# Patient Record
Sex: Female | Born: 1955 | Race: Black or African American | Hispanic: No | Marital: Single | Smoking: Former smoker
Health system: Southern US, Community
[De-identification: ages and names within clinical notes are randomized; demographics above are authoritative.]

## PROBLEM LIST (undated history)

## (undated) DIAGNOSIS — J449 Chronic obstructive pulmonary disease, unspecified: Secondary | ICD-10-CM

## (undated) DIAGNOSIS — F329 Major depressive disorder, single episode, unspecified: Secondary | ICD-10-CM

## (undated) DIAGNOSIS — R51 Headache: Secondary | ICD-10-CM

## (undated) DIAGNOSIS — F32A Depression, unspecified: Secondary | ICD-10-CM

## (undated) DIAGNOSIS — J189 Pneumonia, unspecified organism: Secondary | ICD-10-CM

## (undated) HISTORY — PX: ABDOMINAL HYSTERECTOMY: SHX81

---

## 1998-04-30 ENCOUNTER — Emergency Department (HOSPITAL_COMMUNITY): Admission: EM | Admit: 1998-04-30 | Discharge: 1998-04-30 | Payer: Self-pay | Admitting: Emergency Medicine

## 1998-05-03 ENCOUNTER — Ambulatory Visit (HOSPITAL_COMMUNITY): Admission: RE | Admit: 1998-05-03 | Discharge: 1998-05-03 | Payer: Self-pay

## 1998-05-03 ENCOUNTER — Encounter: Payer: Self-pay | Admitting: *Deleted

## 1998-05-27 ENCOUNTER — Ambulatory Visit (HOSPITAL_COMMUNITY): Admission: RE | Admit: 1998-05-27 | Discharge: 1998-05-27 | Payer: Self-pay | Admitting: *Deleted

## 1998-08-01 ENCOUNTER — Encounter: Payer: Self-pay | Admitting: Emergency Medicine

## 1998-08-01 ENCOUNTER — Emergency Department (HOSPITAL_COMMUNITY): Admission: EM | Admit: 1998-08-01 | Discharge: 1998-08-01 | Payer: Self-pay | Admitting: Emergency Medicine

## 1998-09-20 ENCOUNTER — Emergency Department (HOSPITAL_COMMUNITY): Admission: EM | Admit: 1998-09-20 | Discharge: 1998-09-20 | Payer: Self-pay | Admitting: Emergency Medicine

## 1998-09-21 ENCOUNTER — Encounter: Payer: Self-pay | Admitting: Emergency Medicine

## 1999-04-11 ENCOUNTER — Emergency Department (HOSPITAL_COMMUNITY): Admission: EM | Admit: 1999-04-11 | Discharge: 1999-04-11 | Payer: Self-pay | Admitting: Emergency Medicine

## 1999-06-23 ENCOUNTER — Emergency Department (HOSPITAL_COMMUNITY): Admission: EM | Admit: 1999-06-23 | Discharge: 1999-06-23 | Payer: Self-pay | Admitting: Emergency Medicine

## 1999-06-23 ENCOUNTER — Encounter: Payer: Self-pay | Admitting: Emergency Medicine

## 1999-08-21 ENCOUNTER — Emergency Department (HOSPITAL_COMMUNITY): Admission: EM | Admit: 1999-08-21 | Discharge: 1999-08-21 | Payer: Self-pay | Admitting: Internal Medicine

## 1999-08-21 ENCOUNTER — Encounter: Payer: Self-pay | Admitting: Emergency Medicine

## 1999-08-27 ENCOUNTER — Other Ambulatory Visit: Admission: RE | Admit: 1999-08-27 | Discharge: 1999-08-27 | Payer: Self-pay | Admitting: Obstetrics and Gynecology

## 1999-08-30 ENCOUNTER — Encounter: Payer: Self-pay | Admitting: Obstetrics and Gynecology

## 1999-08-30 ENCOUNTER — Ambulatory Visit (HOSPITAL_COMMUNITY): Admission: RE | Admit: 1999-08-30 | Discharge: 1999-08-30 | Payer: Self-pay | Admitting: Obstetrics and Gynecology

## 1999-10-08 ENCOUNTER — Emergency Department (HOSPITAL_COMMUNITY): Admission: EM | Admit: 1999-10-08 | Discharge: 1999-10-08 | Payer: Self-pay | Admitting: *Deleted

## 2000-12-28 ENCOUNTER — Emergency Department (HOSPITAL_COMMUNITY): Admission: EM | Admit: 2000-12-28 | Discharge: 2000-12-28 | Payer: Self-pay | Admitting: Emergency Medicine

## 2002-11-21 ENCOUNTER — Emergency Department (HOSPITAL_COMMUNITY): Admission: EM | Admit: 2002-11-21 | Discharge: 2002-11-21 | Payer: Self-pay | Admitting: Emergency Medicine

## 2003-05-30 ENCOUNTER — Emergency Department (HOSPITAL_COMMUNITY): Admission: EM | Admit: 2003-05-30 | Discharge: 2003-05-30 | Payer: Self-pay | Admitting: Emergency Medicine

## 2003-06-03 ENCOUNTER — Emergency Department (HOSPITAL_COMMUNITY): Admission: EM | Admit: 2003-06-03 | Discharge: 2003-06-03 | Payer: Self-pay | Admitting: Emergency Medicine

## 2003-07-13 ENCOUNTER — Other Ambulatory Visit: Admission: RE | Admit: 2003-07-13 | Discharge: 2003-07-13 | Payer: Self-pay | Admitting: Cardiology

## 2003-07-13 ENCOUNTER — Other Ambulatory Visit: Admission: RE | Admit: 2003-07-13 | Discharge: 2003-07-13 | Payer: Self-pay | Admitting: Nephrology

## 2004-05-28 ENCOUNTER — Emergency Department (HOSPITAL_COMMUNITY): Admission: EM | Admit: 2004-05-28 | Discharge: 2004-05-28 | Payer: Self-pay | Admitting: Emergency Medicine

## 2004-12-03 ENCOUNTER — Ambulatory Visit: Payer: Self-pay | Admitting: Internal Medicine

## 2004-12-18 ENCOUNTER — Ambulatory Visit: Payer: Self-pay | Admitting: Internal Medicine

## 2004-12-18 ENCOUNTER — Ambulatory Visit: Payer: Self-pay | Admitting: *Deleted

## 2004-12-18 DIAGNOSIS — A599 Trichomoniasis, unspecified: Secondary | ICD-10-CM

## 2005-01-23 ENCOUNTER — Ambulatory Visit: Payer: Self-pay | Admitting: Internal Medicine

## 2005-01-30 ENCOUNTER — Ambulatory Visit (HOSPITAL_COMMUNITY): Admission: RE | Admit: 2005-01-30 | Discharge: 2005-01-30 | Payer: Self-pay | Admitting: Family Medicine

## 2005-02-24 ENCOUNTER — Encounter: Admission: RE | Admit: 2005-02-24 | Discharge: 2005-02-24 | Payer: Self-pay | Admitting: Family Medicine

## 2005-04-03 ENCOUNTER — Ambulatory Visit (HOSPITAL_COMMUNITY): Admission: RE | Admit: 2005-04-03 | Discharge: 2005-04-03 | Payer: Self-pay | Admitting: Gastroenterology

## 2005-04-03 ENCOUNTER — Encounter (INDEPENDENT_AMBULATORY_CARE_PROVIDER_SITE_OTHER): Payer: Self-pay | Admitting: *Deleted

## 2005-04-03 DIAGNOSIS — D126 Benign neoplasm of colon, unspecified: Secondary | ICD-10-CM

## 2005-08-06 ENCOUNTER — Emergency Department (HOSPITAL_COMMUNITY): Admission: EM | Admit: 2005-08-06 | Discharge: 2005-08-06 | Payer: Self-pay | Admitting: Emergency Medicine

## 2005-12-10 ENCOUNTER — Encounter: Payer: Self-pay | Admitting: *Deleted

## 2006-01-02 ENCOUNTER — Ambulatory Visit: Payer: Self-pay | Admitting: Internal Medicine

## 2006-01-02 DIAGNOSIS — F329 Major depressive disorder, single episode, unspecified: Secondary | ICD-10-CM

## 2006-07-16 IMAGING — CR DG SHOULDER 2+V*R*
3 series · 3 of 3 positions shown · non-contrast
Comparison: None.

CLINICAL DATA: Shoulder pain. 
 RIGHT SHOULDER - 3 VIEW:

[t shoulder ap internal righ]
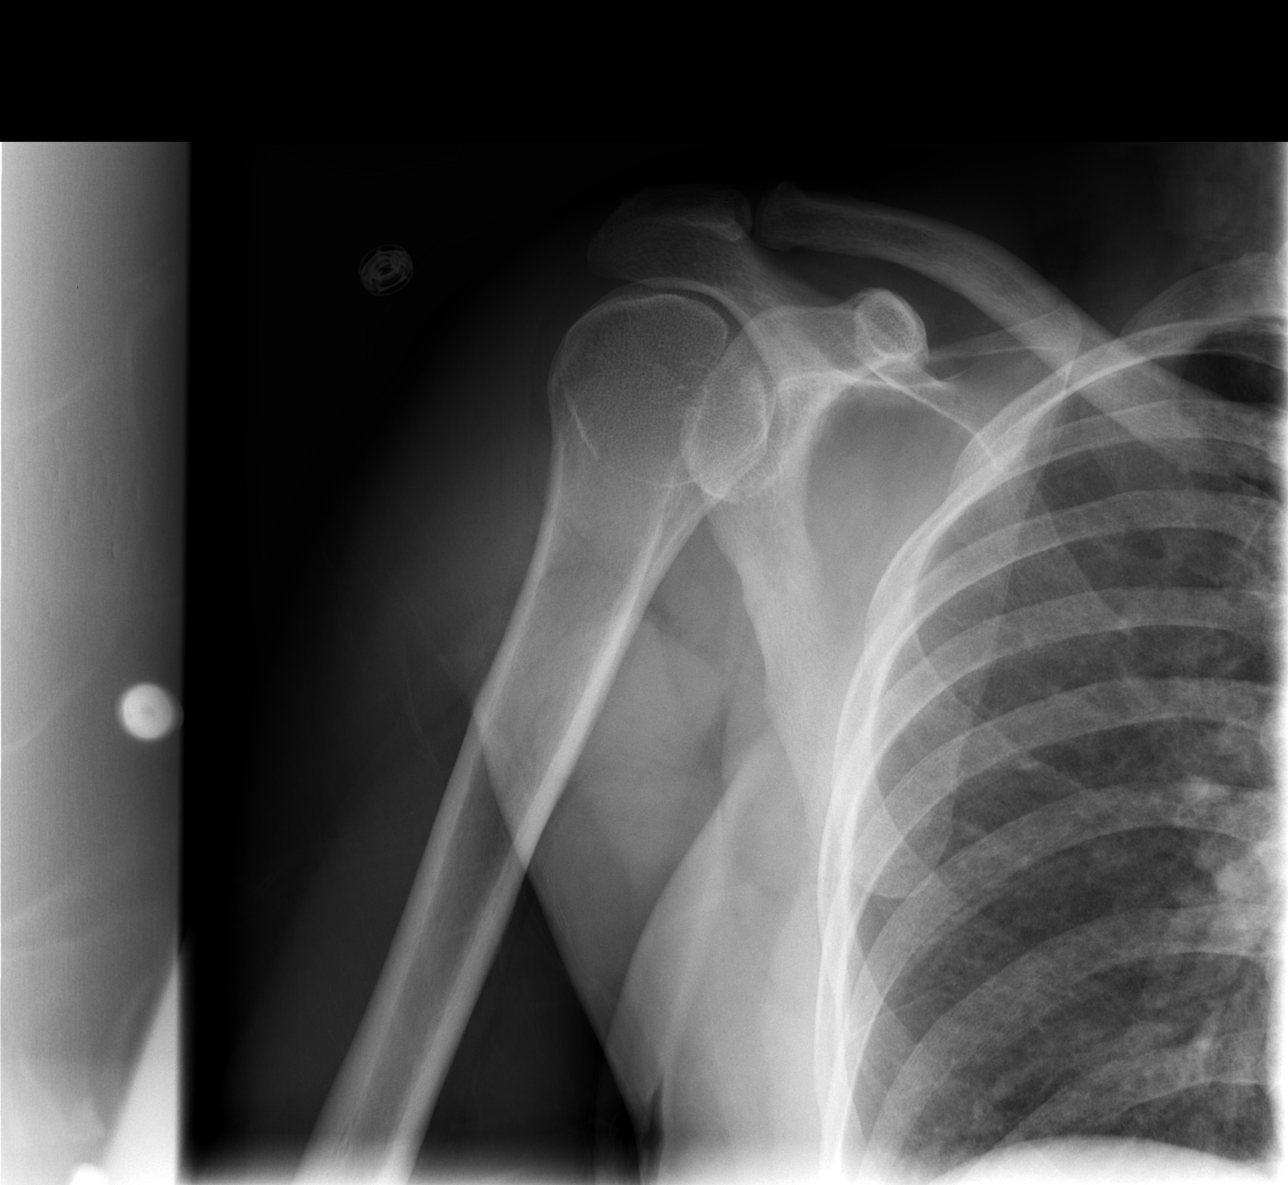

[t shoulder ap external righ]
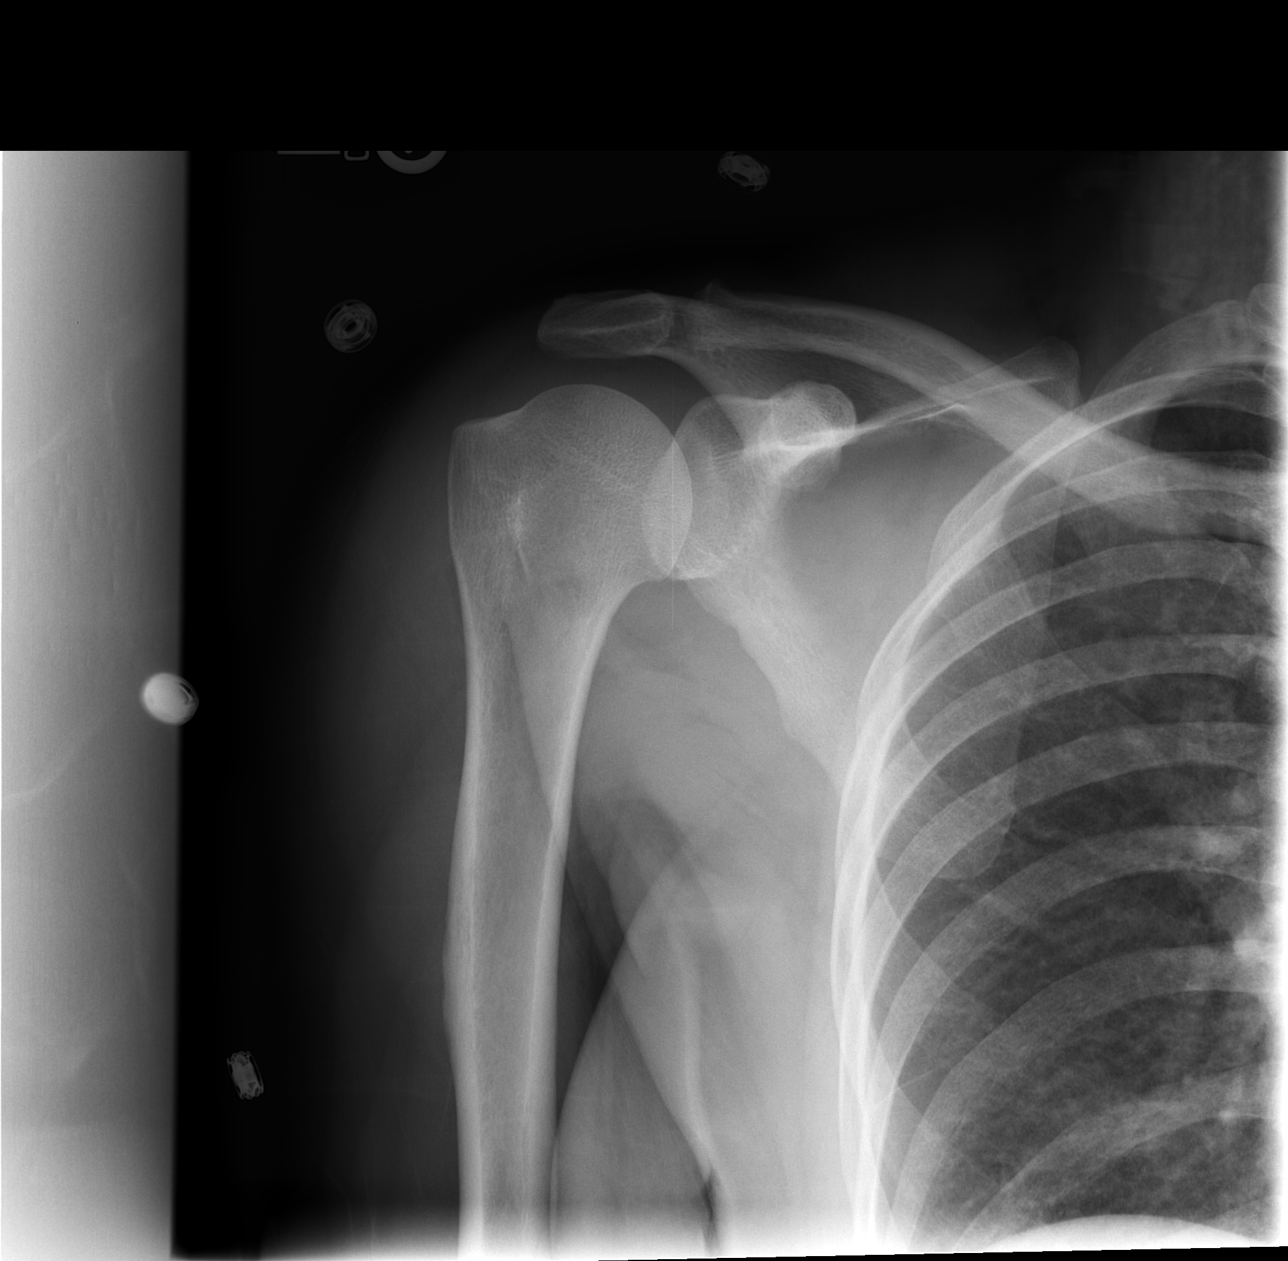

[t shoulder y view right]
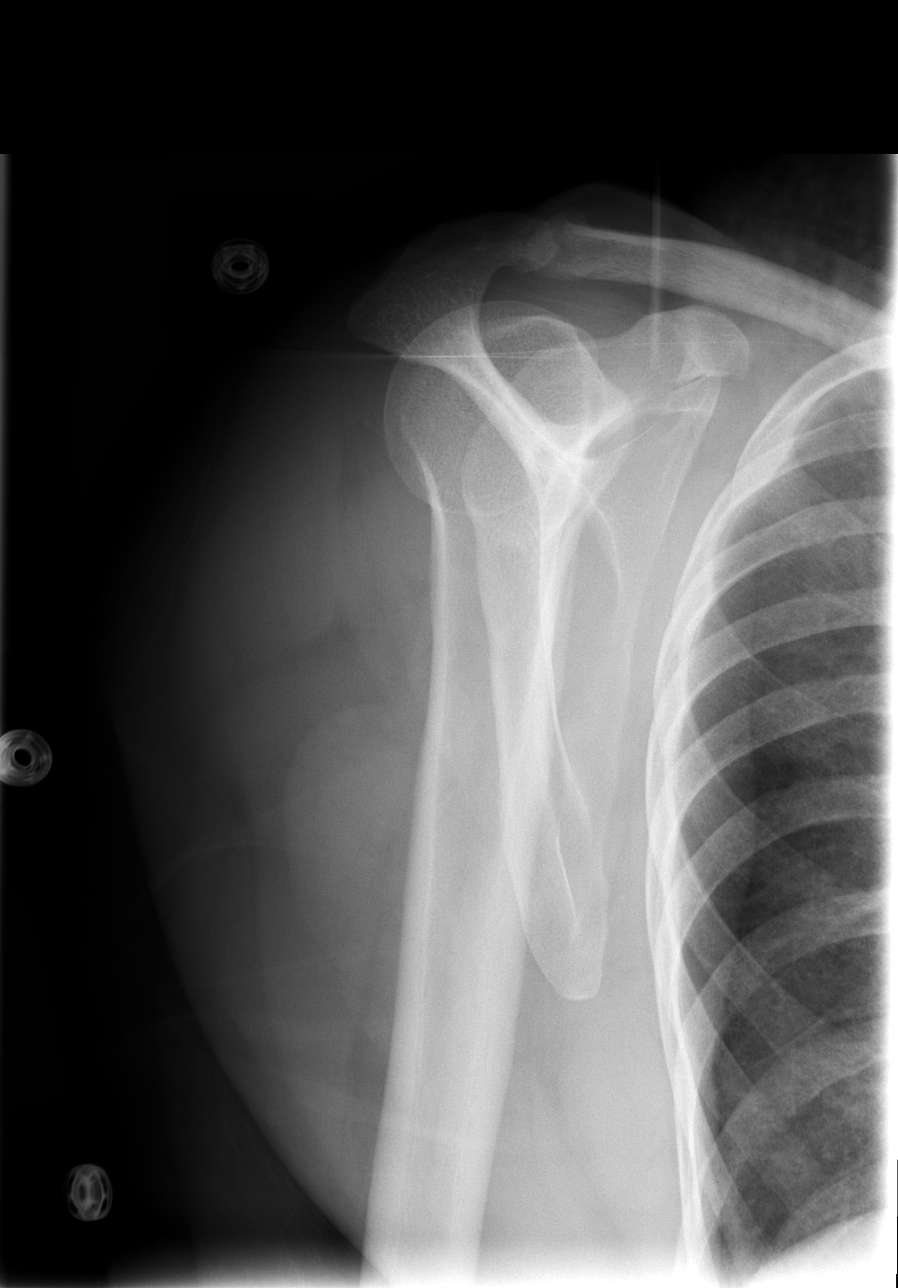

[3 of 3 positions shown; findings below may reference images not displayed]

There is no evidence of fracture or dislocation.  There is no evidence of arthropathy or other focal bone abnormality.  Soft tissues are unremarkable.
IMPRESSION: Negative.
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
## 2007-04-26 DIAGNOSIS — F17200 Nicotine dependence, unspecified, uncomplicated: Secondary | ICD-10-CM | POA: Insufficient documentation

## 2007-04-26 DIAGNOSIS — F341 Dysthymic disorder: Secondary | ICD-10-CM

## 2007-09-02 ENCOUNTER — Ambulatory Visit: Payer: Self-pay | Admitting: Internal Medicine

## 2007-09-02 DIAGNOSIS — A5901 Trichomonal vulvovaginitis: Secondary | ICD-10-CM | POA: Insufficient documentation

## 2007-09-02 DIAGNOSIS — R198 Other specified symptoms and signs involving the digestive system and abdomen: Secondary | ICD-10-CM

## 2007-09-02 LAB — CONVERTED CEMR LAB
Chlamydia, DNA Probe: NEGATIVE
GC Probe Amp, Genital: NEGATIVE

## 2007-09-17 ENCOUNTER — Ambulatory Visit: Payer: Self-pay | Admitting: Internal Medicine

## 2007-10-05 ENCOUNTER — Ambulatory Visit: Payer: Self-pay | Admitting: Internal Medicine

## 2007-10-05 DIAGNOSIS — K219 Gastro-esophageal reflux disease without esophagitis: Secondary | ICD-10-CM | POA: Insufficient documentation

## 2007-10-05 DIAGNOSIS — G4700 Insomnia, unspecified: Secondary | ICD-10-CM | POA: Insufficient documentation

## 2007-10-22 ENCOUNTER — Ambulatory Visit: Payer: Self-pay | Admitting: Family Medicine

## 2007-11-05 ENCOUNTER — Ambulatory Visit: Payer: Self-pay | Admitting: Internal Medicine

## 2007-11-24 ENCOUNTER — Ambulatory Visit: Payer: Self-pay | Admitting: Internal Medicine

## 2007-12-16 ENCOUNTER — Ambulatory Visit: Payer: Self-pay | Admitting: Internal Medicine

## 2007-12-24 ENCOUNTER — Ambulatory Visit (HOSPITAL_COMMUNITY): Admission: RE | Admit: 2007-12-24 | Discharge: 2007-12-24 | Payer: Self-pay | Admitting: Internal Medicine

## 2008-01-21 ENCOUNTER — Ambulatory Visit: Payer: Self-pay | Admitting: Internal Medicine

## 2008-02-24 ENCOUNTER — Telehealth (INDEPENDENT_AMBULATORY_CARE_PROVIDER_SITE_OTHER): Payer: Self-pay | Admitting: Internal Medicine

## 2008-05-24 ENCOUNTER — Ambulatory Visit: Payer: Self-pay | Admitting: Internal Medicine

## 2008-05-25 ENCOUNTER — Ambulatory Visit: Payer: Self-pay | Admitting: Internal Medicine

## 2008-06-12 ENCOUNTER — Ambulatory Visit: Payer: Self-pay | Admitting: Internal Medicine

## 2008-07-18 ENCOUNTER — Telehealth (INDEPENDENT_AMBULATORY_CARE_PROVIDER_SITE_OTHER): Payer: Self-pay | Admitting: Internal Medicine

## 2008-07-18 ENCOUNTER — Encounter (INDEPENDENT_AMBULATORY_CARE_PROVIDER_SITE_OTHER): Payer: Self-pay | Admitting: *Deleted

## 2008-07-19 ENCOUNTER — Ambulatory Visit: Payer: Self-pay | Admitting: Internal Medicine

## 2008-08-02 ENCOUNTER — Encounter (INDEPENDENT_AMBULATORY_CARE_PROVIDER_SITE_OTHER): Payer: Self-pay | Admitting: *Deleted

## 2008-08-20 ENCOUNTER — Encounter (INDEPENDENT_AMBULATORY_CARE_PROVIDER_SITE_OTHER): Payer: Self-pay | Admitting: Internal Medicine

## 2008-09-01 ENCOUNTER — Ambulatory Visit: Payer: Self-pay | Admitting: Internal Medicine

## 2008-09-01 DIAGNOSIS — G5600 Carpal tunnel syndrome, unspecified upper limb: Secondary | ICD-10-CM

## 2008-09-01 DIAGNOSIS — I8390 Asymptomatic varicose veins of unspecified lower extremity: Secondary | ICD-10-CM | POA: Insufficient documentation

## 2008-09-01 LAB — CONVERTED CEMR LAB
Chlamydia, DNA Probe: NEGATIVE
GC Probe Amp, Genital: NEGATIVE

## 2008-09-06 ENCOUNTER — Encounter (INDEPENDENT_AMBULATORY_CARE_PROVIDER_SITE_OTHER): Payer: Self-pay | Admitting: Internal Medicine

## 2008-09-06 ENCOUNTER — Ambulatory Visit: Payer: Self-pay | Admitting: Family Medicine

## 2008-09-15 ENCOUNTER — Ambulatory Visit: Payer: Self-pay | Admitting: Internal Medicine

## 2008-09-27 ENCOUNTER — Ambulatory Visit: Payer: Self-pay | Admitting: Family Medicine

## 2008-10-12 ENCOUNTER — Ambulatory Visit: Payer: Self-pay | Admitting: Internal Medicine

## 2008-10-12 DIAGNOSIS — R3129 Other microscopic hematuria: Secondary | ICD-10-CM | POA: Insufficient documentation

## 2008-10-12 DIAGNOSIS — N760 Acute vaginitis: Secondary | ICD-10-CM | POA: Insufficient documentation

## 2008-10-12 LAB — CONVERTED CEMR LAB
Glucose, Urine, Semiquant: NEGATIVE
Nitrite: NEGATIVE
Protein, U semiquant: NEGATIVE
Urobilinogen, UA: 0.2
WBC Urine, dipstick: NEGATIVE

## 2008-10-13 ENCOUNTER — Encounter (INDEPENDENT_AMBULATORY_CARE_PROVIDER_SITE_OTHER): Payer: Self-pay | Admitting: Internal Medicine

## 2008-10-20 ENCOUNTER — Encounter (INDEPENDENT_AMBULATORY_CARE_PROVIDER_SITE_OTHER): Payer: Self-pay | Admitting: *Deleted

## 2008-11-23 ENCOUNTER — Ambulatory Visit: Payer: Self-pay | Admitting: Internal Medicine

## 2008-11-23 DIAGNOSIS — M25569 Pain in unspecified knee: Secondary | ICD-10-CM

## 2009-05-25 ENCOUNTER — Ambulatory Visit: Payer: Self-pay | Admitting: Internal Medicine

## 2009-06-16 ENCOUNTER — Emergency Department (HOSPITAL_COMMUNITY): Admission: EM | Admit: 2009-06-16 | Discharge: 2009-06-16 | Payer: Self-pay | Admitting: Emergency Medicine

## 2009-06-18 ENCOUNTER — Telehealth (INDEPENDENT_AMBULATORY_CARE_PROVIDER_SITE_OTHER): Payer: Self-pay | Admitting: Internal Medicine

## 2009-06-18 DIAGNOSIS — K029 Dental caries, unspecified: Secondary | ICD-10-CM | POA: Insufficient documentation

## 2009-06-19 ENCOUNTER — Encounter (INDEPENDENT_AMBULATORY_CARE_PROVIDER_SITE_OTHER): Payer: Self-pay | Admitting: Internal Medicine

## 2009-06-20 ENCOUNTER — Encounter (INDEPENDENT_AMBULATORY_CARE_PROVIDER_SITE_OTHER): Payer: Self-pay | Admitting: Internal Medicine

## 2009-06-22 ENCOUNTER — Ambulatory Visit: Payer: Self-pay | Admitting: Internal Medicine

## 2009-06-22 DIAGNOSIS — R197 Diarrhea, unspecified: Secondary | ICD-10-CM | POA: Insufficient documentation

## 2009-06-25 ENCOUNTER — Ambulatory Visit (HOSPITAL_COMMUNITY): Admission: RE | Admit: 2009-06-25 | Discharge: 2009-06-25 | Payer: Self-pay | Admitting: Internal Medicine

## 2009-08-20 LAB — CONVERTED CEMR LAB
BUN: 14 mg/dL (ref 6–23)
CO2: 24 meq/L (ref 19–32)
Calcium: 10.2 mg/dL (ref 8.4–10.5)
Chloride: 106 meq/L (ref 96–112)
Creatinine, Ser: 0.83 mg/dL (ref 0.40–1.20)
Eosinophils Absolute: 0.1 10*3/uL (ref 0.0–0.7)
Eosinophils Relative: 2 % (ref 0–5)
HCT: 42.6 % (ref 36.0–46.0)
Lymphs Abs: 2.8 10*3/uL (ref 0.7–4.0)
MCHC: 31.9 g/dL (ref 30.0–36.0)
MCV: 88 fL (ref 78.0–100.0)
Monocytes Relative: 9 % (ref 3–12)
Platelets: 250 10*3/uL (ref 150–400)
WBC: 6.8 10*3/uL (ref 4.0–10.5)

## 2010-02-16 ENCOUNTER — Telehealth (INDEPENDENT_AMBULATORY_CARE_PROVIDER_SITE_OTHER): Payer: Self-pay | Admitting: Internal Medicine

## 2010-02-26 ENCOUNTER — Encounter (INDEPENDENT_AMBULATORY_CARE_PROVIDER_SITE_OTHER): Payer: Self-pay | Admitting: Internal Medicine

## 2010-07-22 ENCOUNTER — Emergency Department (HOSPITAL_COMMUNITY)
Admission: EM | Admit: 2010-07-22 | Discharge: 2010-07-22 | Payer: Self-pay | Source: Home / Self Care | Admitting: Emergency Medicine

## 2010-09-01 ENCOUNTER — Encounter: Payer: Self-pay | Admitting: Internal Medicine

## 2010-09-10 NOTE — Letter (Signed)
Summary: Generic Letter  [facility]  [address] [place], [place] [address]   Phone: [phone]  Fax: [phone]    02/26/2010  [facility] [address] [place], [place]  [address]  Dear Ms. Marca Ancona,           Sincerely,   Dutch Quint RN

## 2010-09-10 NOTE — Procedures (Signed)
Summary: Gastroenterology  Gastroenterology   Imported By: Arta Bruce 03/05/2010 15:20:06  _____________________________________________________________________  External Attachment:    Type:   Image     Comment:   External Document

## 2010-09-10 NOTE — Progress Notes (Signed)
  Phone Note Outgoing Call   Summary of Call: Please call pt. and let her know she needs a follow up colonoscopy with Dr. Laverle Patter office has been trying to contact her.  If unable to contact via phone, please send out letter. Dr. Marlane Hatcher office number is :  [phone] Initial call taken by: Julieanne Manson MD,  February 16, 2010 2:58 PM  Follow-up for Phone Call        Left message on answering machine for pt to return call at [phone]    Follow-up by: Vesta Mixer CMA,  February 20, 2010 1:00 PM  Additional Follow-up for Phone Call Additional follow up Details #1::        Left message on answering machine to return call.  Letter sent.  Dutch Quint RN  February 26, 2010 12:16 PM

## 2010-09-10 NOTE — Letter (Signed)
Summary: Generic Letter  [facility]  [address] [place], [place] [address]   Phone: [phone]  Fax: [phone]    02/26/2010  [facility] [address] [place], [place]  [address]  Dear Ms. GILMORE,  We have been unable to contact you by telephone.  It is imporant that you please call our office, at your earliest convenience, so that we may speak with you.   Sincerely,   Dutch Quint RN

## 2010-10-22 LAB — POCT I-STAT, CHEM 8
Calcium, Ion: 1.21 mmol/L (ref 1.12–1.32)
HCT: 36 % (ref 36.0–46.0)
TCO2: 24 mmol/L (ref 0–100)

## 2010-10-22 LAB — URINE MICROSCOPIC-ADD ON

## 2010-10-22 LAB — URINALYSIS, ROUTINE W REFLEX MICROSCOPIC
Leukocytes, UA: NEGATIVE
Nitrite: NEGATIVE
Specific Gravity, Urine: 1.021 (ref 1.005–1.030)
pH: 5.5 (ref 5.0–8.0)

## 2010-11-28 ENCOUNTER — Emergency Department (HOSPITAL_COMMUNITY)
Admission: EM | Admit: 2010-11-28 | Discharge: 2010-11-28 | Disposition: A | Discharge status: Home / Self Care | Payer: Self-pay | Attending: Emergency Medicine | Admitting: Emergency Medicine

## 2010-11-28 DIAGNOSIS — L02219 Cutaneous abscess of trunk, unspecified: Secondary | ICD-10-CM | POA: Insufficient documentation

## 2010-11-30 ENCOUNTER — Emergency Department (HOSPITAL_COMMUNITY)
Admission: EM | Admit: 2010-11-30 | Discharge: 2010-11-30 | Disposition: A | Discharge status: Home / Self Care | Payer: Self-pay | Attending: Emergency Medicine | Admitting: Emergency Medicine

## 2010-11-30 DIAGNOSIS — R6883 Chills (without fever): Secondary | ICD-10-CM | POA: Insufficient documentation

## 2010-11-30 DIAGNOSIS — L02219 Cutaneous abscess of trunk, unspecified: Secondary | ICD-10-CM | POA: Insufficient documentation

## 2010-11-30 DIAGNOSIS — R1031 Right lower quadrant pain: Secondary | ICD-10-CM | POA: Insufficient documentation

## 2010-11-30 DIAGNOSIS — F329 Major depressive disorder, single episode, unspecified: Secondary | ICD-10-CM | POA: Insufficient documentation

## 2010-11-30 DIAGNOSIS — R10813 Right lower quadrant abdominal tenderness: Secondary | ICD-10-CM | POA: Insufficient documentation

## 2010-11-30 DIAGNOSIS — F3289 Other specified depressive episodes: Secondary | ICD-10-CM | POA: Insufficient documentation

## 2010-12-02 ENCOUNTER — Emergency Department (HOSPITAL_COMMUNITY)
Admission: EM | Admit: 2010-12-02 | Discharge: 2010-12-02 | Disposition: A | Discharge status: Home / Self Care | Payer: Self-pay | Attending: Emergency Medicine | Admitting: Emergency Medicine

## 2010-12-02 DIAGNOSIS — L02219 Cutaneous abscess of trunk, unspecified: Secondary | ICD-10-CM | POA: Insufficient documentation

## 2010-12-02 DIAGNOSIS — Z09 Encounter for follow-up examination after completed treatment for conditions other than malignant neoplasm: Secondary | ICD-10-CM | POA: Insufficient documentation

## 2010-12-27 NOTE — Op Note (Signed)
NAMEVALARIA, KOHUT                ACCOUNT NO.:  [id number]   MEDICAL RECORD NO.:  [id number]          PATIENT TYPE:  AMB   LOCATION:  ENDO                         FACILITY:  [facility]   PHYSICIAN:  Petra Kuba, M.D.    DATE OF BIRTH:  April 18, 1956   DATE OF PROCEDURE:  04/03/2005  DATE OF DISCHARGE:                                 OPERATIVE REPORT   PROCEDURE:  Colonoscopy with biopsy.   INDICATION:  Family history of colon cancer at a young age, increasing  diarrhea. Consent was signed after risks, benefits, methods, options  thoroughly discussed in the office.   MEDICINES USED:  Demerol 100, Versed 10.   PROCEDURE:  Rectal inspection is pertinent for external hemorrhoids, small  digital exam was negative. Video pediatric adjustable colonoscope was  inserted and with abdominal pressure easily able to be advanced around the  colon to the cecum. No abnormalities were seen on insertion. The cecum was  identified by the appendiceal orifice and ileocecal valve. Scope was  inserted a short ways in the terminal ileum with rolling her on her back.  Photodocumentation and a few scattered biopsies were obtained. The scope was  slowly withdrawn. The prep was adequate. There was some liquid stool that  required washing and suctioning. On slow withdrawal through the colon,  random biopsies were obtained of all areas. The mucosa was normal. There  were three tiny polyps seen. One in the transverse, two in the descending  which were all cold biopsied x2 and put in the third container. No other  abnormalities were seen as we slowly withdrew back to the rectum. Anorectal  pull-through and retroflexion confirmed some small hemorrhoids. Scope was  straightened readvanced a short ways up the left side of the colon. Air was  suctioned, scope removed. The patient tolerated the procedure well. There  was no obvious immediate complication.   ENDOSCOPIC DIAGNOSES:  1.  Internal-external hemorrhoids.  2.   Few tiny transverse and descending polyps cold biopsied.  3.  Otherwise within normal limits to the terminal ileum, status post random      biopsies throughout.   PLAN:  Await pathology not only to determine future colonic screening but to  rule out microscopic colitis. If her diarrhea continues consider sprue  antibodies, upper GI, small bowel follow-through, trial of antispasmodic,  Carafate, etc., and happy to see back p.r.n.           ______________________________  Petra Kuba, M.D.     MEM/MEDQ  D:  04/03/2005  T:  04/03/2005  Job:  [id number]   cc:   Dineen Kid. Reche Dixon, M.D.  [address] [place]  [place] [address]  Fax: [phone]

## 2011-10-15 ENCOUNTER — Other Ambulatory Visit (HOSPITAL_COMMUNITY): Payer: Self-pay | Admitting: Family Medicine

## 2011-10-15 DIAGNOSIS — Z1231 Encounter for screening mammogram for malignant neoplasm of breast: Secondary | ICD-10-CM

## 2011-11-11 ENCOUNTER — Ambulatory Visit (HOSPITAL_COMMUNITY)
Admission: RE | Admit: 2011-11-11 | Discharge: 2011-11-11 | Disposition: A | Discharge status: Home / Self Care | Payer: Self-pay | Source: Ambulatory Visit | Attending: Family Medicine | Admitting: Family Medicine

## 2011-11-11 DIAGNOSIS — Z1231 Encounter for screening mammogram for malignant neoplasm of breast: Secondary | ICD-10-CM | POA: Insufficient documentation

## 2011-11-13 ENCOUNTER — Other Ambulatory Visit: Payer: Self-pay | Admitting: Family Medicine

## 2011-11-13 DIAGNOSIS — R928 Other abnormal and inconclusive findings on diagnostic imaging of breast: Secondary | ICD-10-CM

## 2011-11-18 ENCOUNTER — Ambulatory Visit
Admission: RE | Admit: 2011-11-18 | Discharge: 2011-11-18 | Disposition: A | Discharge status: Home / Self Care | Payer: Self-pay | Source: Ambulatory Visit | Attending: Family Medicine | Admitting: Family Medicine

## 2011-11-18 DIAGNOSIS — R928 Other abnormal and inconclusive findings on diagnostic imaging of breast: Secondary | ICD-10-CM

## 2011-12-15 ENCOUNTER — Encounter (HOSPITAL_COMMUNITY)
Admission: RE | Disposition: A | Discharge status: Home / Self Care | Payer: Self-pay | Source: Ambulatory Visit | Attending: Gastroenterology

## 2011-12-15 ENCOUNTER — Ambulatory Visit (HOSPITAL_COMMUNITY)
Admission: RE | Admit: 2011-12-15 | Discharge: 2011-12-15 | Disposition: A | Discharge status: Home / Self Care | Payer: Self-pay | Source: Ambulatory Visit | Attending: Gastroenterology | Admitting: Gastroenterology

## 2011-12-15 ENCOUNTER — Encounter (HOSPITAL_COMMUNITY): Payer: Self-pay | Admitting: *Deleted

## 2011-12-15 DIAGNOSIS — K5520 Angiodysplasia of colon without hemorrhage: Secondary | ICD-10-CM | POA: Insufficient documentation

## 2011-12-15 DIAGNOSIS — R197 Diarrhea, unspecified: Secondary | ICD-10-CM | POA: Insufficient documentation

## 2011-12-15 DIAGNOSIS — Z860100 Personal history of colon polyps, unspecified: Secondary | ICD-10-CM | POA: Insufficient documentation

## 2011-12-15 DIAGNOSIS — Z800 Family history of malignant neoplasm of digestive organs: Secondary | ICD-10-CM | POA: Insufficient documentation

## 2011-12-15 HISTORY — DX: Depression, unspecified: F32.A

## 2011-12-15 HISTORY — DX: Pneumonia, unspecified organism: J18.9

## 2011-12-15 HISTORY — DX: Major depressive disorder, single episode, unspecified: F32.9

## 2011-12-15 HISTORY — PX: COLONOSCOPY: SHX5424

## 2011-12-15 HISTORY — DX: Chronic obstructive pulmonary disease, unspecified: J44.9

## 2011-12-15 HISTORY — DX: Headache: R51

## 2011-12-15 SURGERY — COLONOSCOPY
Anesthesia: Moderate Sedation

## 2011-12-15 MED ORDER — FENTANYL CITRATE 0.05 MG/ML IJ SOLN
INTRAMUSCULAR | Status: AC
  Filled 2011-12-15: qty 4

## 2011-12-15 MED ORDER — MIDAZOLAM HCL 10 MG/2ML IJ SOLN
INTRAMUSCULAR | Status: AC
  Filled 2011-12-15: qty 4

## 2011-12-15 MED ORDER — MIDAZOLAM HCL 5 MG/5ML IJ SOLN
INTRAMUSCULAR | Status: DC | PRN
  Administered 2011-12-15: 2.5 mg via INTRAVENOUS
  Administered 2011-12-15 (×3): 2 mg via INTRAVENOUS
  Administered 2011-12-15: 1 mg via INTRAVENOUS
  Administered 2011-12-15: 2.5 mg via INTRAVENOUS

## 2011-12-15 MED ORDER — SODIUM CHLORIDE 0.9 % IV SOLN: Freq: Once | INTRAVENOUS | Status: DC

## 2011-12-15 MED ORDER — FENTANYL NICU IV SYRINGE 50 MCG/ML
INJECTION | INTRAMUSCULAR | Status: DC | PRN
  Administered 2011-12-15 (×5): 25 ug via INTRAVENOUS

## 2011-12-15 MED ORDER — DIPHENHYDRAMINE HCL 50 MG/ML IJ SOLN
INTRAMUSCULAR | Status: AC
  Filled 2011-12-15: qty 1

## 2011-12-15 NOTE — Discharge Instructions (Signed)
Colonoscopy A colonoscopy is an exam to evaluate your entire colon. In this exam, your colon is cleansed. A long fiberoptic tube is inserted through your rectum and into your colon. The fiberoptic scope (endoscope) is a long bundle of enclosed and very flexible fibers. These fibers transmit light to the area examined and send images from that area to your caregiver. Discomfort is usually minimal. You may be given a drug to help you sleep (sedative) during or prior to the procedure. This exam helps to detect lumps (tumors), polyps, inflammation, and areas of bleeding. Your caregiver may also take a small piece of tissue (biopsy) that will be examined under a microscope. LET YOUR CAREGIVER KNOW ABOUT:   Allergies to food or medicine.   Medicines taken, including vitamins, herbs, eyedrops, over-the-counter medicines, and creams.   Use of steroids (by mouth or creams).   Previous problems with anesthetics or numbing medicines.   History of bleeding problems or blood clots.   Previous surgery.   Other health problems, including diabetes and kidney problems.   Possibility of pregnancy, if this applies.  BEFORE THE PROCEDURE   A clear liquid diet may be required for 2 days before the exam.   Ask your caregiver about changing or stopping your regular medications.   Liquid injections (enemas) or laxatives may be required.   A large amount of electrolyte solution may be given to you to drink over a short period of time. This solution is used to clean out your colon.   You should be present 60 minutes prior to your procedure or as directed by your caregiver.  AFTER THE PROCEDURE   If you received a sedative or pain relieving medication, you will need to arrange for someone to drive you home.   Occasionally, there is a little blood passed with the first bowel movement. Do not be concerned.  FINDING OUT THE RESULTS OF YOUR TEST Not all test results are available during your visit. If your test  results are not back during the visit, make an appointment with your caregiver to find out the results. Do not assume everything is normal if you have not heard from your caregiver or the medical facility. It is important for you to follow up on all of your test results. HOME CARE INSTRUCTIONS   It is not unusual to pass moderate amounts of gas and experience mild abdominal cramping following the procedure. This is due to air being used to inflate your colon during the exam. Walking or a warm pack on your belly (abdomen) may help.   You may resume all normal meals and activities after sedatives and medicines have worn off.   Only take over-the-counter or prescription medicines for pain, discomfort, or fever as directed by your caregiver. Do not use aspirin or blood thinners if a biopsy was taken. Consult your caregiver for medicine usage if biopsies were taken.  SEEK IMMEDIATE MEDICAL CARE IF:   You have a fever.   You pass large blood clots or fill a toilet with blood following the procedure. This may also occur 10 to 14 days following the procedure. This is more likely if a biopsy was taken.   You develop abdominal pain that keeps getting worse and cannot be relieved with medicine.  Document Released: 07/25/2000 Document Revised: 07/17/2011 Document Reviewed: 03/09/2008 [employer] Patient Information 2012 [employer]. 

## 2011-12-15 NOTE — Op Note (Signed)
[facility] [address] [place], [place]  [address]  COLONOSCOPY PROCEDURE REPORT  PATIENT:  Holly Gaines, Holly Gaines  MR#:  [id number] BIRTHDATE:  1956-03-12, 55 yrs. old  GENDER:  female ENDOSCOPIST:  Vida Rigger, MD REF. BY: PROCEDURE DATE:  12/15/2011 PROCEDURE:  Colonoscopy with biopsy ASA CLASS:  Class II INDICATIONS:  unexplained diarrhea, family history of colon cancer, history of polyps MEDICATIONS:  125 mcg of fentanyl 12 mg Versed DESCRIPTION OF PROCEDURE:   After the risks benefits and alternatives of the procedure were thoroughly explained, informed consent was obtained.  The Pentax Ped Colon [id number] endoscope was introduced through the anus and advanced to the terminal ileum which was intubated for a short distance, which required rolling her on her back but no abdominal pressure. The quality of the prep was adequate..  The instrument was then slowly withdrawn as the colon was fully examined. On insertion no obvious abnormality was seen on withdrawal the terminal ileum was normal and random colon biopsies were obtained from all areas but other than a small hepatic flexure AVM no other abnormalities were seen as we slowly withdrew back to the rectum . Anal rectal pull-through and retroflexion were normal and the scope was reinserted a short was at the left side of the colon air was suctioned scope removed patient tolerated the procedure only fair due to discomfort but there was no obvious immediate complication <<PROCEDUREIMAGES>>  FINDINGS: 1 one small hepatic flexure AVM not bleeding 2. Otherwise within normal limits to the terminal ileum status post random colon biopsies to rule out microscopic colitis  COMPLICATIONS:  None  IMPRESSION: Above  RECOMMENDATIONS: Await pathology followup when necessary recheck colonoscopy in 5 years using propofol  ______________________________ Vida Rigger, MD  CC:  n. eSIGNEDVida Rigger at 12/15/2011 10:19  AM  Janeann Merl, [phone]

## 2011-12-16 ENCOUNTER — Encounter (HOSPITAL_COMMUNITY): Payer: Self-pay | Admitting: Gastroenterology

## 2011-12-24 ENCOUNTER — Other Ambulatory Visit: Payer: Self-pay

## 2013-09-20 ENCOUNTER — Other Ambulatory Visit (HOSPITAL_COMMUNITY): Payer: Self-pay | Admitting: Internal Medicine

## 2013-09-20 ENCOUNTER — Ambulatory Visit (HOSPITAL_COMMUNITY)
Admission: RE | Admit: 2013-09-20 | Discharge: 2013-09-20 | Disposition: A | Discharge status: Home / Self Care | Payer: No Typology Code available for payment source | Source: Ambulatory Visit | Attending: Internal Medicine | Admitting: Internal Medicine

## 2013-09-20 DIAGNOSIS — R05 Cough: Secondary | ICD-10-CM | POA: Insufficient documentation

## 2013-09-20 DIAGNOSIS — R0602 Shortness of breath: Secondary | ICD-10-CM | POA: Insufficient documentation

## 2013-09-20 DIAGNOSIS — J40 Bronchitis, not specified as acute or chronic: Secondary | ICD-10-CM

## 2013-09-20 DIAGNOSIS — J449 Chronic obstructive pulmonary disease, unspecified: Secondary | ICD-10-CM | POA: Insufficient documentation

## 2013-09-20 DIAGNOSIS — R059 Cough, unspecified: Secondary | ICD-10-CM | POA: Insufficient documentation

## 2013-09-20 DIAGNOSIS — R042 Hemoptysis: Secondary | ICD-10-CM | POA: Insufficient documentation

## 2013-09-20 DIAGNOSIS — J4489 Other specified chronic obstructive pulmonary disease: Secondary | ICD-10-CM | POA: Insufficient documentation

## 2014-01-20 ENCOUNTER — Other Ambulatory Visit (HOSPITAL_COMMUNITY): Payer: Self-pay | Admitting: Internal Medicine

## 2014-01-20 ENCOUNTER — Encounter (INDEPENDENT_AMBULATORY_CARE_PROVIDER_SITE_OTHER): Payer: Self-pay

## 2014-01-20 ENCOUNTER — Ambulatory Visit (HOSPITAL_COMMUNITY)
Admission: RE | Admit: 2014-01-20 | Discharge: 2014-01-20 | Disposition: A | Discharge status: Home / Self Care | Payer: No Typology Code available for payment source | Source: Ambulatory Visit | Attending: Internal Medicine | Admitting: Internal Medicine

## 2014-01-20 DIAGNOSIS — M62838 Other muscle spasm: Secondary | ICD-10-CM

## 2014-01-20 DIAGNOSIS — M4802 Spinal stenosis, cervical region: Secondary | ICD-10-CM

## 2014-01-20 DIAGNOSIS — M25519 Pain in unspecified shoulder: Secondary | ICD-10-CM | POA: Insufficient documentation

## 2016-08-14 ENCOUNTER — Other Ambulatory Visit: Payer: Self-pay | Admitting: Internal Medicine

## 2016-08-14 DIAGNOSIS — Z1231 Encounter for screening mammogram for malignant neoplasm of breast: Secondary | ICD-10-CM

## 2016-08-28 ENCOUNTER — Ambulatory Visit
Admission: RE | Admit: 2016-08-28 | Discharge: 2016-08-28 | Disposition: A | Discharge status: Home / Self Care | Payer: Medicare Other | Source: Ambulatory Visit | Attending: Internal Medicine | Admitting: Internal Medicine

## 2016-08-28 DIAGNOSIS — Z1231 Encounter for screening mammogram for malignant neoplasm of breast: Secondary | ICD-10-CM

## 2016-09-05 ENCOUNTER — Ambulatory Visit (HOSPITAL_COMMUNITY)
Admission: RE | Admit: 2016-09-05 | Discharge: 2016-09-05 | Disposition: A | Discharge status: Home / Self Care | Payer: Medicare Other | Source: Ambulatory Visit | Attending: Internal Medicine | Admitting: Internal Medicine

## 2016-09-05 ENCOUNTER — Other Ambulatory Visit (HOSPITAL_COMMUNITY): Payer: Self-pay | Admitting: Internal Medicine

## 2016-09-05 DIAGNOSIS — Z0000 Encounter for general adult medical examination without abnormal findings: Secondary | ICD-10-CM | POA: Insufficient documentation

## 2016-09-05 DIAGNOSIS — Z720 Tobacco use: Secondary | ICD-10-CM

## 2017-01-23 ENCOUNTER — Other Ambulatory Visit (HOSPITAL_COMMUNITY): Payer: Self-pay | Admitting: Internal Medicine

## 2017-01-23 ENCOUNTER — Ambulatory Visit (HOSPITAL_COMMUNITY)
Admission: RE | Admit: 2017-01-23 | Discharge: 2017-01-23 | Disposition: A | Discharge status: Home / Self Care | Payer: Medicare Other | Source: Ambulatory Visit | Attending: Internal Medicine | Admitting: Internal Medicine

## 2017-01-23 DIAGNOSIS — M549 Dorsalgia, unspecified: Secondary | ICD-10-CM | POA: Diagnosis not present

## 2017-01-23 DIAGNOSIS — I700 Atherosclerosis of aorta: Secondary | ICD-10-CM | POA: Insufficient documentation

## 2017-01-23 DIAGNOSIS — M5440 Lumbago with sciatica, unspecified side: Secondary | ICD-10-CM

## 2017-10-01 ENCOUNTER — Other Ambulatory Visit: Payer: Self-pay | Admitting: Internal Medicine

## 2017-10-01 DIAGNOSIS — Z1231 Encounter for screening mammogram for malignant neoplasm of breast: Secondary | ICD-10-CM

## 2017-10-21 ENCOUNTER — Ambulatory Visit: Payer: Medicare Other

## 2020-07-19 ENCOUNTER — Ambulatory Visit: Payer: Medicare Other | Admitting: Family Medicine
